# Patient Record
Sex: Male | Born: 1995 | Race: White | Marital: Married | Smoking: Never smoker
Health system: Southern US, Community
[De-identification: ages and names within clinical notes are randomized; demographics above are authoritative.]

## PROBLEM LIST (undated history)

## (undated) DIAGNOSIS — Z806 Family history of leukemia: Secondary | ICD-10-CM

## (undated) DIAGNOSIS — Z803 Family history of malignant neoplasm of breast: Secondary | ICD-10-CM

## (undated) HISTORY — DX: Family history of malignant neoplasm of breast: Z80.3

## (undated) HISTORY — PX: WISDOM TOOTH EXTRACTION: SHX21

## (undated) HISTORY — DX: Family history of leukemia: Z80.6

---

## 2018-07-03 ENCOUNTER — Other Ambulatory Visit: Payer: Self-pay | Admitting: Nurse Practitioner

## 2018-07-03 ENCOUNTER — Ambulatory Visit
Admission: RE | Admit: 2018-07-03 | Discharge: 2018-07-03 | Disposition: A | Discharge status: Home / Self Care | Payer: No Typology Code available for payment source | Source: Ambulatory Visit | Attending: Nurse Practitioner | Admitting: Nurse Practitioner

## 2018-07-03 DIAGNOSIS — Z021 Encounter for pre-employment examination: Secondary | ICD-10-CM

## 2019-10-15 ENCOUNTER — Other Ambulatory Visit: Payer: Self-pay

## 2019-10-15 ENCOUNTER — Other Ambulatory Visit: Payer: Self-pay | Admitting: Family Medicine

## 2019-10-15 ENCOUNTER — Ambulatory Visit: Payer: Self-pay

## 2019-10-15 DIAGNOSIS — S0033XA Contusion of nose, initial encounter: Secondary | ICD-10-CM

## 2020-11-05 ENCOUNTER — Emergency Department (HOSPITAL_COMMUNITY)
Admission: EM | Admit: 2020-11-05 | Discharge: 2020-11-05 | Disposition: A | Discharge status: Home / Self Care | Payer: No Typology Code available for payment source | Attending: Emergency Medicine | Admitting: Emergency Medicine

## 2020-11-05 ENCOUNTER — Other Ambulatory Visit: Payer: Self-pay

## 2020-11-05 ENCOUNTER — Encounter (HOSPITAL_COMMUNITY): Payer: Self-pay | Admitting: *Deleted

## 2020-11-05 DIAGNOSIS — W268XXA Contact with other sharp object(s), not elsewhere classified, initial encounter: Secondary | ICD-10-CM | POA: Diagnosis not present

## 2020-11-05 DIAGNOSIS — S60512A Abrasion of left hand, initial encounter: Secondary | ICD-10-CM | POA: Insufficient documentation

## 2020-11-05 DIAGNOSIS — Z23 Encounter for immunization: Secondary | ICD-10-CM | POA: Diagnosis not present

## 2020-11-05 DIAGNOSIS — L600 Ingrowing nail: Secondary | ICD-10-CM

## 2020-11-05 DIAGNOSIS — S61431A Puncture wound without foreign body of right hand, initial encounter: Secondary | ICD-10-CM

## 2020-11-05 DIAGNOSIS — S6991XA Unspecified injury of right wrist, hand and finger(s), initial encounter: Secondary | ICD-10-CM | POA: Diagnosis present

## 2020-11-05 MED ORDER — CEPHALEXIN 250 MG PO CAPS
500.0000 mg | ORAL_CAPSULE | Freq: Once | ORAL | Status: AC
  Administered 2020-11-05: 500 mg via ORAL
  Filled 2020-11-05: qty 2

## 2020-11-05 MED ORDER — TETANUS-DIPHTH-ACELL PERTUSSIS 5-2.5-18.5 LF-MCG/0.5 IM SUSY
0.5000 mL | PREFILLED_SYRINGE | Freq: Once | INTRAMUSCULAR | Status: AC
  Administered 2020-11-05: 0.5 mL via INTRAMUSCULAR
  Filled 2020-11-05: qty 0.5

## 2020-11-05 MED ORDER — CEPHALEXIN 500 MG PO CAPS: 500.0000 mg | ORAL_CAPSULE | Freq: Two times a day (BID) | ORAL | 0 refills | Status: AC

## 2020-11-05 NOTE — Discharge Instructions (Signed)
You can wash your hand wounds with regular soap and water.  Your next dose of antibiotic is due tonight.  Please complete the full course of antibiotics.  You can take Tylenol and Motrin as needed for pain.  If you notice redness streaking up your arm, or you begin having fevers, or you begin having numbness and loss of feeling in your hand, or there is pus draining from your wound, you need to seek medical attention.  These may be signs of a worsening infection that may require IV antibiotics.
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
## 2021-01-29 ENCOUNTER — Other Ambulatory Visit: Payer: Self-pay

## 2021-01-29 ENCOUNTER — Emergency Department (HOSPITAL_COMMUNITY)
Admission: EM | Admit: 2021-01-29 | Discharge: 2021-01-29 | Disposition: A | Discharge status: Home / Self Care | Payer: 59 | Attending: Emergency Medicine | Admitting: Emergency Medicine

## 2021-01-29 DIAGNOSIS — L299 Pruritus, unspecified: Secondary | ICD-10-CM | POA: Diagnosis present

## 2021-01-29 DIAGNOSIS — R21 Rash and other nonspecific skin eruption: Secondary | ICD-10-CM

## 2021-01-29 MED ORDER — PREDNISONE 10 MG (21) PO TBPK: ORAL_TABLET | Freq: Every day | ORAL | 0 refills | Status: DC
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
## 2021-01-29 NOTE — ED Triage Notes (Signed)
Pt presents to ED via ems cc rash. Pt states having a rash on bilateral lower extremities for the past 2 days. Pt states running through the woods recently. Pt reports similar rash a few months ago after finding a tick on his body. Respirations equal, unlabored. A&Ox4.

## 2021-01-29 NOTE — Discharge Instructions (Addendum)
I am prescribing you a strong steroid medication called prednisone.  Please only take this as prescribed.  Please take it early in the morning, as this medication can be stimulating and make it difficult to sleep at night.  Please continue to monitor your symptoms closely.  If you develop any new or worsening symptoms please come back to the emergency department.  It was a pleasure to meet you.

## 2021-10-06 ENCOUNTER — Ambulatory Visit: Payer: 59 | Admitting: Podiatry

## 2021-10-06 DIAGNOSIS — L600 Ingrowing nail: Secondary | ICD-10-CM

## 2021-10-12 ENCOUNTER — Encounter: Payer: Self-pay | Admitting: Podiatry

## 2021-10-12 NOTE — Progress Notes (Signed)
?  Subjective:  ?Patient ID: Robert [name], male    DOB: 01-30-1996,  MRN: [id number] ? ?Chief Complaint  ?Patient presents with  ? Ingrown Toenail  ? ? ?26 y.o. male presents with the above complaint.  Patient presents with complaint of right lateral border of the hallux ingrown.  Patient states painful to touch is progressive gotten worse.  He would like to have it removed he has not seen anyone else prior to seeing me.  Hurts with ambulation.  Pain scale 7 out of 10. ? ? ?Review of Systems: Negative except as noted in the HPI. Denies N/V/F/Ch. ? ?No past medical history on file. ? ?Current Outpatient Medications:  ?  predniSONE (STERAPRED UNI-PAK 21 TAB) 10 MG (21) TBPK tablet, Take by mouth daily. Take 6 tabs by mouth daily  for 2 days, then 5 tabs for 2 days, then 4 tabs for 2 days, then 3 tabs for 2 days, 2 tabs for 2 days, then 1 tab by mouth daily for 2 days, Disp: 42 tablet, Rfl: 0 ? ?Social History  ? ?Tobacco Use  ?Smoking Status Never  ?Smokeless Tobacco Never  ? ? ?No Known Allergies ?Objective:  ?There were no vitals filed for this visit. ?There is no height or weight on file to calculate BMI. ?Constitutional Well developed. ?Well nourished.  ?Vascular Dorsalis pedis pulses palpable bilaterally. ?Posterior tibial pulses palpable bilaterally. ?Capillary refill normal to all digits.  ?No cyanosis or clubbing noted. ?Pedal hair growth normal.  ?Neurologic Normal speech. ?Oriented to person, place, and time. ?Epicritic sensation to light touch grossly present bilaterally.  ?Dermatologic Painful ingrowing nail at lateral nail borders of the hallux nail right. ?No other open wounds. ?No skin lesions.  ?Orthopedic: Normal joint ROM without pain or crepitus bilaterally. ?No visible deformities. ?No bony tenderness.  ? ?Radiographs: None ?Assessment:  ? ?1. Ingrown toenail of right foot   ? ?Plan:  ?Patient was evaluated and treated and all questions answered. ? ?Ingrown Nail, right ?-Patient elects to proceed  with minor surgery to remove ingrown toenail removal today. Consent reviewed and signed by patient. ?-Ingrown nail excised. See procedure note. ?-Educated on post-procedure care including soaking. Written instructions provided and reviewed. ?-Patient to follow up in 2 weeks for nail check. ? ?Procedure: Excision of Ingrown Toenail ?Location: Right 1st toe lateral nail borders. ?Anesthesia: Lidocaine 1% plain; 1.5 mL and Marcaine 0.5% plain; 1.5 mL, digital block. ?Skin Prep: Betadine. ?Dressing: Silvadene; telfa; dry, sterile, compression dressing. ?Technique: Following skin prep, the toe was exsanguinated and a tourniquet was secured at the base of the toe. The affected nail border was freed, split with a nail splitter, and excised. Chemical matrixectomy was then performed with phenol and irrigated out with alcohol. The tourniquet was then removed and sterile dressing applied. ?Disposition: Patient tolerated procedure well. Patient to return in 2 weeks for follow-up.  ? ?No follow-ups on file. ?

## 2021-12-06 ENCOUNTER — Ambulatory Visit: Payer: 59 | Admitting: Podiatry

## 2021-12-06 DIAGNOSIS — L600 Ingrowing nail: Secondary | ICD-10-CM

## 2021-12-06 DIAGNOSIS — L539 Erythematous condition, unspecified: Secondary | ICD-10-CM | POA: Diagnosis not present

## 2021-12-06 MED ORDER — DOXYCYCLINE HYCLATE 100 MG PO TABS: 100.0000 mg | ORAL_TABLET | Freq: Two times a day (BID) | ORAL | 0 refills | Status: AC

## 2021-12-07 ENCOUNTER — Other Ambulatory Visit: Payer: Self-pay | Admitting: Podiatry

## 2021-12-07 ENCOUNTER — Telehealth: Payer: Self-pay | Admitting: Podiatry

## 2021-12-07 MED ORDER — CEPHALEXIN 500 MG PO CAPS: 500.0000 mg | ORAL_CAPSULE | Freq: Three times a day (TID) | ORAL | 0 refills | Status: DC

## 2021-12-07 NOTE — Telephone Encounter (Signed)
Spoke with patient - pharmacy did not have his insurance card on file with pharmacy.  Will call back if there is any other problems

## 2021-12-07 NOTE — Telephone Encounter (Signed)
Patient called and stated that he went to his pharmacy to pick up

## 2021-12-07 NOTE — Telephone Encounter (Signed)
Patient called and stated that he went to his pharmacy to pick up Doxycycline from his pharmacy. They are wanting to pay over $100.00 for it. Patient wanted to know if there was an alternative that is covered by is insurance.  Please advise

## 2022-01-02 ENCOUNTER — Encounter (HOSPITAL_COMMUNITY): Payer: Self-pay | Admitting: Emergency Medicine

## 2022-01-02 ENCOUNTER — Other Ambulatory Visit: Payer: Self-pay

## 2022-01-02 ENCOUNTER — Ambulatory Visit: Payer: Self-pay

## 2022-01-02 ENCOUNTER — Emergency Department (HOSPITAL_COMMUNITY): Payer: 59

## 2022-01-02 ENCOUNTER — Emergency Department (HOSPITAL_COMMUNITY)
Admission: EM | Admit: 2022-01-02 | Discharge: 2022-01-02 | Disposition: A | Discharge status: Home / Self Care | Payer: 59 | Attending: Emergency Medicine | Admitting: Emergency Medicine

## 2022-01-02 DIAGNOSIS — K625 Hemorrhage of anus and rectum: Secondary | ICD-10-CM | POA: Diagnosis present

## 2022-01-02 DIAGNOSIS — K648 Other hemorrhoids: Secondary | ICD-10-CM | POA: Insufficient documentation

## 2022-01-02 LAB — CBC WITH DIFFERENTIAL/PLATELET
Abs Immature Granulocytes: 0.03 10*3/uL (ref 0.00–0.07)
Basophils Absolute: 0 10*3/uL (ref 0.0–0.1)
Basophils Relative: 1 %
Eosinophils Absolute: 0.1 10*3/uL (ref 0.0–0.5)
Eosinophils Relative: 2 %
HCT: 44.4 % (ref 39.0–52.0)
Hemoglobin: 15 g/dL (ref 13.0–17.0)
Immature Granulocytes: 0 %
Lymphocytes Relative: 32 %
Lymphs Abs: 2.5 10*3/uL (ref 0.7–4.0)
MCH: 28.5 pg (ref 26.0–34.0)
MCHC: 33.8 g/dL (ref 30.0–36.0)
MCV: 84.4 fL (ref 80.0–100.0)
Monocytes Absolute: 0.5 10*3/uL (ref 0.1–1.0)
Monocytes Relative: 6 %
Neutro Abs: 4.7 10*3/uL (ref 1.7–7.7)
Neutrophils Relative %: 59 %
Platelets: 294 10*3/uL (ref 150–400)
RBC: 5.26 MIL/uL (ref 4.22–5.81)
RDW: 12.6 % (ref 11.5–15.5)
WBC: 7.9 10*3/uL (ref 4.0–10.5)
nRBC: 0 % (ref 0.0–0.2)

## 2022-01-02 LAB — COMPREHENSIVE METABOLIC PANEL
ALT: 39 U/L (ref 0–44)
AST: 24 U/L (ref 15–41)
Albumin: 4 g/dL (ref 3.5–5.0)
Alkaline Phosphatase: 58 U/L (ref 38–126)
Anion gap: 8 (ref 5–15)
BUN: 13 mg/dL (ref 6–20)
CO2: 24 mmol/L (ref 22–32)
Calcium: 9.5 mg/dL (ref 8.9–10.3)
Chloride: 108 mmol/L (ref 98–111)
Creatinine, Ser: 1.11 mg/dL (ref 0.61–1.24)
GFR, Estimated: >60 mL/min (ref >60)
Glucose, Bld: 89 mg/dL (ref 70–99)
Potassium: 4.1 mmol/L (ref 3.5–5.1)
Sodium: 140 mmol/L (ref 135–145)
Total Bilirubin: 0.6 mg/dL (ref 0.3–1.2)
Total Protein: 7.7 g/dL (ref 6.5–8.1)

## 2022-01-02 LAB — TYPE AND SCREEN
ABO/RH(D): O POS
Antibody Screen: NEGATIVE

## 2022-01-02 LAB — POC OCCULT BLOOD, ED: Fecal Occult Bld: POSITIVE — AB

## 2022-01-02 LAB — LIPASE, BLOOD: Lipase: 24 U/L (ref 11–51)

## 2022-01-02 MED ORDER — IOHEXOL 300 MG/ML  SOLN
100.0000 mL | Freq: Once | INTRAMUSCULAR | Status: AC | PRN
  Administered 2022-01-02: 100 mL via INTRAVENOUS

## 2022-01-02 MED ORDER — SODIUM CHLORIDE (PF) 0.9 % IJ SOLN
INTRAMUSCULAR | Status: AC
  Filled 2022-01-02: qty 50

## 2022-01-02 NOTE — ED Triage Notes (Signed)
Pt reports rectal bleeding x 1 week now. Reports minimal abd pain. Never had this happen before. Family hx of Cancer.

## 2022-01-02 NOTE — ED Provider Triage Note (Signed)
Emergency Medicine Provider Triage Evaluation Note  Robert [name] , a 26 y.o. male  was evaluated in triage.  Pt complains of hematochezia for the past week. The patient denies black stool but reports blood in the toilet, stool, and whenever he wipes. Denies any nausea or vomiting. The patient reports that he has some mild lower abdominal pain, otherwise is normal. Denies any recent foreign travel. Denies any fevers. Denies any melena. He reports his stool has been softer, but not watery. Reports strong family h/o cancer. Denies any trauma.   Review of Systems  Positive:  Negative:   Physical Exam  BP 133/72 (BP Location: Left Arm)   Pulse 71   Temp 98.2 F (36.8 C) (Oral)   Resp 16   SpO2 95%  Gen:   Awake, no distress   Resp:  Normal effort  MSK:   Moves extremities without difficulty  Other:  Abdomen soft and non tender  Medical Decision Making  Medically screening exam initiated at 12:30 PM.  Appropriate orders placed.  Robert [name] was informed that the remainder of the evaluation will be completed by another provider, this initial triage assessment does not replace that evaluation, and the importance of remaining in the ED until their evaluation is complete.  Will order labs and CT abd.   Robert Rich, PA-C 01/02/22 1234

## 2022-01-02 NOTE — Discharge Instructions (Addendum)
Your CT scan was without acute abnormality.  You do have evidence of occult bleeding with no active bleeding noted on exam.  Your symptoms could be due to an internal hemorrhoid.  Additionally a diverticular bleed is still possible.  Recommend you follow-up outpatient with a gastroenterologist and establish care with a PCP.  CT scan results: IMPRESSION:  1. No acute abdominal/pelvic findings, mass lesions or adenopathy.  2. No obvious anorectal abnormality. No extravasation of contrast  material is identified to suggest an active bleed.  3. 10 mm low-attenuation lesion in segment 7 of the liver is likely  a benign cyst or hemangioma.

## 2022-01-02 NOTE — ED Provider Notes (Signed)
[facility]-EMERGENCY DEPT Provider Note   CSN: [id number] Arrival date & time: 01/02/22  1149     History  Chief Complaint  Patient presents with   Rectal Bleeding    Paschal Blanton is a 26 y.o. male.   Rectal Bleeding   26 year old male presenting to the emergency department with a history of rectal bleeding for the past week.  The patient states that he has had dark blood and bright red blood in his stool whenever he wipes.  He states that it is mixed in with his stool.  He denies any abdominal pain or rectal pain.  He is concerned because he has a strong family history of cancer.  He denies any fevers or chills.  He denies any syncope.  He denies any rectal pain.  He denies a history of hemorrhoids.  Home Medications Prior to Admission medications   Medication Sig Start Date End Date Taking? Authorizing Provider  cephALEXin (KEFLEX) 500 MG capsule Take 1 capsule (500 mg total) by mouth 3 (three) times daily. 12/07/21   Candelaria Stagers, DPM  predniSONE (STERAPRED UNI-PAK 21 TAB) 10 MG (21) TBPK tablet Take by mouth daily. Take 6 tabs by mouth daily  for 2 days, then 5 tabs for 2 days, then 4 tabs for 2 days, then 3 tabs for 2 days, 2 tabs for 2 days, then 1 tab by mouth daily for 2 days 01/29/21   Placido Sou, PA-C      Allergies    Patient has no known allergies.    Review of Systems   Review of Systems  Gastrointestinal:  Positive for blood in stool and hematochezia. Negative for rectal pain.  All other systems reviewed and are negative.   Physical Exam Updated Vital Signs BP 122/73 (BP Location: Right Arm)   Pulse 75   Temp 98 F (36.7 C) (Oral)   Resp 16   SpO2 100%  Physical Exam Vitals and nursing note reviewed. Exam conducted with a chaperone present.  Constitutional:      General: He is not in acute distress.    Appearance: He is well-developed.  HENT:     Head: Normocephalic and atraumatic.  Eyes:     Conjunctiva/sclera:  Conjunctivae normal.  Cardiovascular:     Rate and Rhythm: Normal rate and regular rhythm.  Pulmonary:     Effort: Pulmonary effort is normal. No respiratory distress.     Breath sounds: Normal breath sounds.  Abdominal:     Palpations: Abdomen is soft.     Tenderness: There is no abdominal tenderness.     Comments: Abdomen soft, nontender, nondistended, no rebound or guarding  Genitourinary:    Rectum: Guaiac result positive. Internal hemorrhoid present. No mass, tenderness, anal fissure or external hemorrhoid.     Comments: Possible internal hemorrhoid palpated, no external hemorrhoids appreciated, no anal fissure appreciated, no significant rectal tenderness on exam, no purulence, fecal occult positive Musculoskeletal:        General: No swelling.     Cervical back: Neck supple.  Skin:    General: Skin is warm and dry.     Capillary Refill: Capillary refill takes less than 2 seconds.  Neurological:     Mental Status: He is alert.  Psychiatric:        Mood and Affect: Mood normal.     ED Results / Procedures / Treatments   Labs (all labs ordered are listed, but only abnormal results are displayed) Labs Reviewed  POC  OCCULT BLOOD, ED - Abnormal; Notable for the following components:      Result Value   Fecal Occult Bld POSITIVE (*)    All other components within normal limits  COMPREHENSIVE METABOLIC PANEL  CBC WITH DIFFERENTIAL/PLATELET  LIPASE, BLOOD  TYPE AND SCREEN  ABO/RH    EKG None  Radiology CT ABDOMEN PELVIS W CONTRAST  Result Date: 01/02/2022 CLINICAL DATA:  Right red blood per rectum. EXAM: CT ABDOMEN AND PELVIS WITH CONTRAST TECHNIQUE: Multidetector CT imaging of the abdomen and pelvis was performed using the standard protocol following bolus administration of intravenous contrast. RADIATION DOSE REDUCTION: This exam was performed according to the departmental dose-optimization program which includes automated exposure control, adjustment of the mA and/or  kV according to patient size and/or use of iterative reconstruction technique. CONTRAST:  OMNIPAQUE IOHEXOL 300 MG/ML  SOLN COMPARISON:  None Available. FINDINGS: Lower chest: Insert lung bases Hepatobiliary: 10 mm low-attenuation lesion in segment 7 is likely a benign cyst or hemangioma. No worrisome hepatic lesions or intrahepatic biliary dilatation. The gallbladder is unremarkable. No common bile duct dilatation. Pancreas: No mass, inflammation or ductal dilatation. Spleen: Normal size.  No focal lesions. Adrenals/Urinary Tract: The adrenal glands are normal. No renal lesions or hydronephrosis. 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 No renal or obstructing ureteral calculi or bladder calculi. Stomach/Bowel: The stomach, duodenum, small bowel and colon are. No acute inflammatory process, mass lesions or obstructive findings. The terminal ileum and appendix are normal. No obvious anorectal abnormality. No extravasation of contrast material is identified. Vascular/Lymphatic: The aorta is normal in caliber. No dissection. The branch vessels are patent. The major venous structures are patent. No mesenteric or retroperitoneal mass or adenopathy. Small scattered lymph nodes are noted. Reproductive: The prostate gland and seminal vesicles are unremarkable. Other: No pelvic mass or adenopathy. No free pelvic fluid collections. No inguinal mass or adenopathy. No abdominal wall hernia or subcutaneous lesions. Musculoskeletal: No significant bony findings. IMPRESSION: 1. No acute abdominal/pelvic findings, mass lesions or adenopathy. 2. No obvious anorectal abnormality. No extravasation of contrast material is identified to suggest an active bleed. 3. 10 mm low-attenuation lesion in segment 7 of the liver is likely a benign cyst or hemangioma. Electronically Signed   By: Rudie Meyer M.D.   On: 01/02/2022 15:45    Procedures Procedures    Medications Ordered in ED Medications  iohexol  (OMNIPAQUE) 300 MG/ML solution 100 mL (100 mLs Intravenous Contrast Given 01/02/22 1519)  sodium chloride (PF) 0.9 % injection (  Given by Other 01/02/22 1543)    ED Course/ Medical Decision Making/ A&P                           Medical Decision Making Amount and/or Complexity of Data Reviewed Labs: ordered.  Risk Prescription drug management.    26 year old male presenting to the emergency department with a history of rectal bleeding for the past week.  The patient states that he has had dark blood and bright red blood in his stool whenever he wipes.  He states that it is mixed in with his stool.  He denies any abdominal pain or rectal pain.  He is concerned because he has a strong family history of cancer.  He denies any fevers or chills.  He denies any syncope.  He denies any rectal pain.  He denies a history of hemorrhoids.  On exam, the patient was fecal occult positive with a possible internal  hemorrhoid palpated.  No external hemorrhoids appreciated, no significant tenderness or anal fissure appreciated.  Symptoms could be due to internal hemorrhoid that she is bleeding versus a diverticular bleed.    Laboratory evaluation significant for fecal occult positive, lipase normal, CMP without abnormality, CBC without abnormality, no anemia hemoglobin 15.  A CT abdomen pelvis with contrast was performed and was negative for acute abnormality in the abdomen or pelvis.  IMPRESSION:  1. No acute abdominal/pelvic findings, mass lesions or adenopathy.  2. No obvious anorectal abnormality. No extravasation of contrast  material is identified to suggest an active bleed.  3. 10 mm low-attenuation lesion in segment 7 of the liver is likely  a benign cyst or hemangioma.    Patient has no PCP at this time.  A TOC consult was placed for this.  Additionally, referral to gastroenterology for follow-up was placed due to the patient's lower GI bleeding and concern for family history of  cancer/malignancy.   Final Clinical Impression(s) / ED Diagnoses Final diagnoses:  Rectal bleeding  Internal hemorrhoid    Rx / DC Orders ED Discharge Orders          Ordered    Ambulatory referral to Gastroenterology        01/02/22 1704              Ernie Avena, MD 01/02/22 1710

## 2022-01-02 NOTE — Telephone Encounter (Signed)
    Chief Complaint: Dark and bright red blood in stool and when he wipes Symptoms: Mild abdominal pain Frequency: 1 week ago Pertinent Negatives: Patient denies constipation Disposition: [x] ED /[] Urgent Care (no appt availability in office) / [] Appointment(In office/virtual)/ []  [facility]/ [] Home Care/ [] Refused Recommended Disposition /[] [facility]/ []  Follow-up with PCP Additional Notes: Has an appointment with a new PCP in 2 weeks.  Reason for Disposition  [1] MODERATE rectal bleeding (small blood clots, passing blood without stool, or toilet water turns red) AND [2] more than once a day  Answer Assessment - Initial Assessment Questions 1. APPEARANCE of BLOOD: "What color is it?" "Is it passed separately, on the surface of the stool, or mixed in with the stool?"      Darker red, mixed in stool and toilet 2. AMOUNT: "How much blood was passed?"      Large amount 3. FREQUENCY: "How many times has blood been passed with the stools?"      1 week ago 4. ONSET: "When was the blood first seen in the stools?" (Days or weeks)      1 week 5. DIARRHEA: "Is there also some diarrhea?" If Yes, ask: "How many diarrhea stools in the past 24 hours?"      Loose stool 6. CONSTIPATION: "Do you have constipation?" If Yes, ask: "How bad is it?"     No 7. RECURRENT SYMPTOMS: "Have you had blood in your stools before?" If Yes, ask: "When was the last time?" and "What happened that time?"      No 8. BLOOD THINNERS: "Do you take any blood thinners?" (e.g., Coumadin/warfarin, Pradaxa/dabigatran, aspirin)     No 9. OTHER SYMPTOMS: "Do you have any other symptoms?"  (e.g., abdomen pain, vomiting, dizziness, fever)     Mild pain 10. PREGNANCY: "Is there any chance you are pregnant?" "When was your last menstrual period?"       N/A  Protocols used: Rectal Bleeding-A-AH

## 2023-03-20 ENCOUNTER — Encounter: Payer: Self-pay | Admitting: Podiatry

## 2023-03-20 ENCOUNTER — Ambulatory Visit: Payer: 59 | Admitting: Podiatry

## 2023-03-20 DIAGNOSIS — L600 Ingrowing nail: Secondary | ICD-10-CM | POA: Diagnosis not present

## 2023-03-20 MED ORDER — DOXYCYCLINE HYCLATE 100 MG PO TABS: 100.0000 mg | ORAL_TABLET | Freq: Two times a day (BID) | ORAL | 1 refills | Status: DC

## 2023-03-20 NOTE — Patient Instructions (Signed)

## 2023-03-21 NOTE — Progress Notes (Signed)
Subjective:   Patient ID: Robert [name], male   DOB: 27 y.o.   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  I infiltrated the right hallux 60 mg Xylocaine Marcaine mixture sterile prep done using sterile instrumentation removed the lateral border exposed matrix applied phenol 3 applications 30 seconds followed by alcohol lavage sterile dressing gave instructions on soaks wear dressing 24 hours take it off earlier if throbbing were to occur encouraged to call questions concerns which may arise

## 2023-12-10 ENCOUNTER — Encounter: Payer: Self-pay | Admitting: Family

## 2023-12-10 ENCOUNTER — Ambulatory Visit: Admitting: Family

## 2023-12-10 VITALS — BP 106/63 | HR 72 | Temp 98.8°F | Resp 16 | Ht 68.0 in | Wt 217.0 lb

## 2023-12-10 DIAGNOSIS — E66811 Obesity, class 1: Secondary | ICD-10-CM | POA: Diagnosis not present

## 2023-12-10 DIAGNOSIS — Z6832 Body mass index (BMI) 32.0-32.9, adult: Secondary | ICD-10-CM | POA: Insufficient documentation

## 2023-12-10 DIAGNOSIS — Z0000 Encounter for general adult medical examination without abnormal findings: Secondary | ICD-10-CM | POA: Insufficient documentation

## 2023-12-10 DIAGNOSIS — Z809 Family history of malignant neoplasm, unspecified: Secondary | ICD-10-CM | POA: Insufficient documentation

## 2023-12-10 DIAGNOSIS — R635 Abnormal weight gain: Secondary | ICD-10-CM

## 2023-12-10 LAB — COMPREHENSIVE METABOLIC PANEL WITH GFR
ALT: 14 U/L (ref 0–53)
AST: 11 U/L (ref 0–37)
Albumin: 4.3 g/dL (ref 3.5–5.2)
Alkaline Phosphatase: 58 U/L (ref 39–117)
BUN: 23 mg/dL (ref 6–23)
CO2: 31 meq/L (ref 19–32)
Calcium: 9.7 mg/dL (ref 8.4–10.5)
Chloride: 103 meq/L (ref 96–112)
Creatinine, Ser: 1.08 mg/dL (ref 0.40–1.50)
GFR: 93.76 mL/min (ref >60.00)
Glucose, Bld: 77 mg/dL (ref 70–99)
Potassium: 4.6 meq/L (ref 3.5–5.1)
Sodium: 141 meq/L (ref 135–145)
Total Bilirubin: 0.4 mg/dL (ref 0.2–1.2)
Total Protein: 7.1 g/dL (ref 6.0–8.3)

## 2023-12-10 LAB — CBC WITH DIFFERENTIAL/PLATELET
Absolute Lymphocytes: 2704 {cells}/uL (ref 850–3900)
Absolute Monocytes: 749 {cells}/uL (ref 200–950)
Basophils Absolute: 62 {cells}/uL (ref 0–200)
Basophils Relative: 0.6 %
Eosinophils Absolute: 73 {cells}/uL (ref 15–500)
Eosinophils Relative: 0.7 %
HCT: 44 % (ref 38.5–50.0)
Hemoglobin: 14.9 g/dL (ref 13.2–17.1)
MCH: 29 pg (ref 27.0–33.0)
MCHC: 33.9 g/dL (ref 32.0–36.0)
MCV: 85.8 fL (ref 80.0–100.0)
MPV: 9.9 fL (ref 7.5–12.5)
Monocytes Relative: 7.2 %
Neutro Abs: 6812 {cells}/uL (ref 1500–7800)
Neutrophils Relative %: 65.5 %
Platelets: 339 10*3/uL (ref 140–400)
RBC: 5.13 10*6/uL (ref 4.20–5.80)
RDW: 12.4 % (ref 11.0–15.0)
Total Lymphocyte: 26 %
WBC: 10.4 10*3/uL (ref 3.8–10.8)

## 2023-12-10 LAB — TSH: TSH: 2.65 u[IU]/mL (ref 0.35–5.50)

## 2023-12-10 NOTE — Assessment & Plan Note (Signed)
-   Reduce daily caloric intake to 1900 calories. - Reassess weight loss progress in 3 months. If no progress, consider zepbound.

## 2023-12-10 NOTE — Progress Notes (Signed)
 Subjective:     Patient ID: Robert [name], male    DOB: 20-Mar-1996, 28 y.o.   MRN: [id number]  Chief Complaint  Patient presents with   New Patient (Initial Visit)    HPI  Discussed the use of AI scribe software for clinical note transcription with the patient, who gave verbal consent to proceed.  History of Present Illness  Robert [name] is a 28 year old male who presents for an initial visit to establish care and discuss family history of cancer and weight management.  Cancer risk assessment - Extensive family history of cancer, including mother (breast cancer), father (unspecified cancer, chemotherapy within last two years), grandmother (white cell leukemia, deceased), and uncle (type unspecified) - Interest in genetic testing to assess personal cancer risk  Weight management and body composition - Concern regarding weight and family history of obesity - Since February, implemented dietary changes: eliminated energy drinks and soda, counts calories and macronutrients, meal preps with lean foods such as chicken - Current weight 217 pounds (down from 212 pounds in the morning) - Caloric intake goal of 2200 calories daily with specific macronutrient targets - Engages in intense workouts with significant strength gains - Weight loss of only five to six pounds since February, no change in pant size - Concerned about lack of fat loss despite efforts  Hearing loss - History of hearing loss attributed to time in the [employer] - Prescribed hearing aids but does not use them - Failed multiple hearing tests - Denies significant impact of hearing loss on daily life  Gastrointestinal symptoms - Regular diarrhea associated with high-protein diet, including protein shakes and Greek yogurt  Caffeine withdrawal - Experienced withdrawal symptoms, including headaches and cravings, after abrupt cessation of energy drinks  General review of systems - No cough, cold symptoms, skin  rashes, or unusual muscle or joint pain - No urinary symptoms or frequent headaches - No significant concerns about depression or anxiety, though work-related stress is present - 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 No pertinent past medical history.  Past Surgical History:  Procedure Laterality Date   WISDOM TOOTH EXTRACTION Bilateral     Family History  Problem Relation Age of Onset   Diabetes Mother    Breast cancer Mother    Leukemia Father    Leukemia Maternal Grandmother    Cancer Other    Cancer Paternal Uncle     Social History   Socioeconomic History   Marital status: Married    Spouse name: Not on file   Number of children: Not on file   Years of education: Not on file   Highest education level: 12th grade  Occupational History   Not on file  Tobacco Use   Smoking status: Never   Smokeless tobacco: Never  Vaping Use   Vaping status: Former  Substance and Sexual Activity   Alcohol use: Not Currently   Drug use: Not Currently   Sexual activity: Yes    Partners: Female  Other Topics Concern   Not on file  Social History Narrative   Married   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  No children   Social Drivers of Corporate investment banker Strain: Low Risk  (12/09/2023)   Overall Financial Resource Strain (CARDIA)    Difficulty of Paying Living Expenses: Not very hard  Food Insecurity: No Food Insecurity (12/09/2023)   Hunger Vital Sign    Worried About Running Out of Food in the Last Year: Never true    Ran Out of Food in the Last Year: Never true  Transportation Needs: No Transportation Needs (12/09/2023)   PRAPARE - Administrator, Civil Service (Medical):  No    Lack of Transportation (Non-Medical): No  Physical Activity: Sufficiently Active (12/10/2023)   Exercise Vital Sign    Days of Exercise per Week: 3 days    Minutes of Exercise per Session: 60 min  Stress: Stress Concern Present (12/09/2023)   [employer] of Occupational Health - Occupational Stress Questionnaire    Feeling of Stress: Rather much  Social Connections: Socially Isolated (12/09/2023)   Social Connection and Isolation Panel    Frequency of Communication with Friends and Family: Once a week    Frequency of Social Gatherings with Friends and Family: Once a week    Attends Religious Services: Never    Database administrator or Organizations: No    Attends Engineer, structural: Not on file    Marital Status: Married  Intimate Partner Violence: Unknown (12/31/2021)   Received from [facility]   HITS    Physically Hurt: Not on file    Insult or Talk Down To: Not on file    Threaten Physical Harm: Not on file    Scream or Curse: Not on file    Outpatient Medications Prior to Visit  Medication Sig Dispense Refill   Multiple Vitamin (MULTIVITAMINS PO) Take by mouth.     cephALEXin  (KEFLEX ) 500 MG capsule Take 1 capsule (500 mg total) by mouth 3 (three) times daily. 30 capsule 0   doxycycline  (VIBRA -TABS) 100 MG tablet Take 1 tablet (100 mg total) by mouth 2 (two) times daily. 20 tablet 1   predniSONE  (STERAPRED UNI-PAK 21 TAB) 10 MG (21) TBPK tablet Take by mouth daily. Take 6 tabs by mouth daily  for 2 days, then 5 tabs for 2 days, then 4 tabs for 2 days, then 3 tabs for 2 days, 2 tabs for 2 days, then 1 tab by mouth daily for 2 days 42 tablet 0   No facility-administered medications prior to visit.    No Known Allergies  Review of Systems  HENT:  Positive for hearing loss (from [employer]). Negative for congestion.   Eyes:  Negative for blurred vision.  Respiratory:  Negative for cough.   Cardiovascular:  Negative for leg swelling.  Gastrointestinal:   Positive for diarrhea (he attributes to protein shakes). Negative for constipation.  Genitourinary:  Negative for dysuria and frequency.  Musculoskeletal:  Negative for joint pain and myalgias.  Skin:  Negative for rash.       Left forearm rash from poison ivy  Neurological:  Negative for headaches.  Psychiatric/Behavioral:  Negative for depression. The patient is not nervous/anxious.        Objective:    Physical Exam Constitutional:      General: He is not in acute distress.    Appearance: He is well-developed.  HENT:     Head: Normocephalic and atraumatic.     Right Ear: Tympanic membrane and ear canal normal.     Left Ear: Tympanic membrane and ear canal  normal.   Cardiovascular:     Rate and Rhythm: Normal rate and regular rhythm.     Heart sounds: No murmur heard. Pulmonary:     Effort: Pulmonary effort is normal. No respiratory distress.     Breath sounds: Normal breath sounds. No wheezing or rales.   Skin:    General: Skin is warm and dry.   Neurological:     Mental Status: He is alert and oriented to person, place, and time.   Psychiatric:        Behavior: Behavior normal.        Thought Content: Thought content normal.      BP 106/63 (BP Location: Right Arm, Patient Position: Sitting, Cuff Size: Large)   Pulse 72   Temp 98.8 F (37.1 C) (Oral)   Resp 16   Ht 5' 8 (1.727 m)   Wt 217 lb (98.4 kg)   SpO2 98%   BMI 32.99 kg/m  Wt Readings from Last 3 Encounters:  12/10/23 217 lb (98.4 kg)  01/29/21 180 lb (81.6 kg)  11/05/20 180 lb (81.6 kg)       Assessment & Plan:   Problem List Items Addressed This Visit       Unprioritized   Preventative health care - Primary    Minimal weight loss despite significant lifestyle changes. Discussed potential thyroid issues and dietary adjustments. - Check thyroid function and kidney function, including electrolytes. -Immunizations up to date.      Family history of cancer   Relevant Orders   Ambulatory  referral to Genetics   Class 1 obesity with body mass index (BMI) of 32.0 to 32.9 in adult    - Reduce daily caloric intake to 1900 calories. - Reassess weight loss progress in 3 months. If no progress, consider zepbound.      Other Visit Diagnoses       Weight gain       Relevant Orders   TSH   Comp Met (CMET)   CBC with Differential/Platelet       I have discontinued Mabel Reaves's predniSONE , cephALEXin , and doxycycline . I am also having him maintain his Multiple Vitamin (MULTIVITAMINS PO).  No orders of the defined types were placed in this encounter.

## 2023-12-10 NOTE — Patient Instructions (Signed)
 VISIT SUMMARY:  Today, you visited to establish care and discuss your family history of cancer, weight management, and other health concerns. We reviewed your family history of cancer, your current weight management efforts, and other health issues including hearing loss and gastrointestinal symptoms.  YOUR PLAN:  OBESITY: You have made significant lifestyle changes but have seen minimal weight loss. -We will check your thyroid and kidney function, including electrolytes. -Reduce your daily caloric intake to 1900 calories. -We will reassess your weight loss progress in 3 months.  DIARRHEA: You have regular diarrhea, possibly linked to your high-protein diet and dairy intake. -Consider reducing dairy intake to see if symptoms improve.  CANCER RISK ASSESSMENT: You have a significant family history of cancer and are interested in genetic testing. -We will refer you to a genetic counselor for cancer risk assessment and potential genetic testing.  HEARING LOSS: You have a history of hearing loss but do not use your prescribed hearing aids. -No immediate action needed as it does not significantly impact your daily activities.  GENERAL HEALTH MAINTENANCE: We discussed the importance of managing your health information and appointments. -Please use MyChart to manage your health information and appointments.

## 2023-12-10 NOTE — Assessment & Plan Note (Addendum)
  Minimal weight loss despite significant lifestyle changes. Discussed potential thyroid issues and dietary adjustments. - Check thyroid function and kidney function, including electrolytes. -Immunizations up to date.

## 2023-12-11 ENCOUNTER — Ambulatory Visit: Payer: Self-pay | Admitting: Family

## 2024-03-13 ENCOUNTER — Ambulatory Visit: Admitting: Family

## 2024-04-03 ENCOUNTER — Emergency Department (HOSPITAL_BASED_OUTPATIENT_CLINIC_OR_DEPARTMENT_OTHER): Admitting: Radiology

## 2024-04-03 ENCOUNTER — Other Ambulatory Visit: Payer: Self-pay

## 2024-04-03 ENCOUNTER — Encounter (HOSPITAL_BASED_OUTPATIENT_CLINIC_OR_DEPARTMENT_OTHER): Payer: Self-pay

## 2024-04-03 ENCOUNTER — Emergency Department (HOSPITAL_BASED_OUTPATIENT_CLINIC_OR_DEPARTMENT_OTHER)
Admission: EM | Admit: 2024-04-03 | Discharge: 2024-04-03 | Disposition: A | Discharge status: Home / Self Care | Attending: Emergency Medicine | Admitting: Emergency Medicine

## 2024-04-03 DIAGNOSIS — Y990 Civilian activity done for income or pay: Secondary | ICD-10-CM | POA: Diagnosis not present

## 2024-04-03 DIAGNOSIS — S63636A Sprain of interphalangeal joint of right little finger, initial encounter: Secondary | ICD-10-CM | POA: Insufficient documentation

## 2024-04-03 DIAGNOSIS — S6991XA Unspecified injury of right wrist, hand and finger(s), initial encounter: Secondary | ICD-10-CM | POA: Diagnosis present

## 2024-04-03 DIAGNOSIS — M7989 Other specified soft tissue disorders: Secondary | ICD-10-CM | POA: Insufficient documentation
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
## 2024-04-03 NOTE — ED Triage Notes (Signed)
 Pt c/o R pinky finger injury approx PTA. No meds, +ROM

## 2024-04-03 NOTE — Discharge Instructions (Signed)
 Follow-up with your outpatient clinic, Workmen's Comp. clinic, as needed or as indicated by your employer, continue to wear finger brace for at least the next 2 to 3 days to allow the sprain to completely heal.  Use Tylenol and/or ibuprofen as needed for pain.

## 2024-04-10 ENCOUNTER — Inpatient Hospital Stay

## 2024-04-10 DIAGNOSIS — Z803 Family history of malignant neoplasm of breast: Secondary | ICD-10-CM | POA: Diagnosis not present

## 2024-04-10 DIAGNOSIS — Z806 Family history of leukemia: Secondary | ICD-10-CM | POA: Diagnosis not present

## 2024-04-10 DIAGNOSIS — Z1379 Encounter for other screening for genetic and chromosomal anomalies: Secondary | ICD-10-CM | POA: Diagnosis not present

## 2024-04-10 LAB — GENETIC SCREENING ORDER
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
## 2024-04-24 ENCOUNTER — Ambulatory Visit: Payer: Self-pay | Admitting: Licensed Clinical Social Worker

## 2024-04-24 ENCOUNTER — Telehealth: Payer: Self-pay | Admitting: Licensed Clinical Social Worker

## 2024-04-24 ENCOUNTER — Encounter: Payer: Self-pay | Admitting: Licensed Clinical Social Worker

## 2024-04-24 DIAGNOSIS — Z1379 Encounter for other screening for genetic and chromosomal anomalies: Secondary | ICD-10-CM

## 2024-04-24 NOTE — Progress Notes (Signed)
 HPI:   Robert [name] was previously seen in the [facility] due to a family history of cancer and concerns regarding a hereditary predisposition to cancer. Please refer to our prior cancer genetics clinic note for more information regarding our discussion, assessment and recommendations, at the time. Robert [name] recent genetic test results were disclosed to him, as were recommendations warranted by these results. These results and recommendations are discussed in more detail below.  CANCER HISTORY:  Oncology History   No history exists.    FAMILY HISTORY:  We obtained a detailed, 4-generation family history.  Significant diagnoses are listed below: Family History  Problem Relation Age of Onset   Diabetes Mother    Breast cancer Mother 47   Arthritis Mother    Leukemia Father 11   Leukemia Maternal Grandmother        White Cell Leukemia   Cancer Paternal Uncle        Unknown Cancer      GENETIC TEST RESULTS:  The [facility] CancerNext-Expanded+RNA Panel found no pathogenic mutations.   The CancerNext-Expanded gene panel offered by [facility] and includes sequencing, rearrangement, and RNA analysis for the following 77 genes: AIP, ALK, APC, ATM, AXIN2, BAP1, BARD1, BMPR1A, BRCA1, BRCA2, BRIP1, CDC73, CDH1, CDK4, CDKN1B, CDKN2A, CEBPA, CHEK2, CTNNA1, DDX41, DICER1, ETV6, FH, FLCN, GATA2, LZTR1, MAX, MBD4, MEN1, MET, MLH1, MSH2, MSH3, MSH6, MUTYH, NF1, NF2, NTHL1, PALB2, PHOX2B, PMS2, POT1, PRKAR1A, PTCH1, PTEN, RAD51C, RAD51D, RB1, RET, RPS20, RUNX1, SDHA, SDHAF2, SDHB, SDHC, SDHD, SMAD4, SMARCA4, SMARCB1, SMARCE1, STK11, SUFU, TMEM127, TP53, TSC1, TSC2, VHL, and WT1 (sequencing and deletion/duplication); EGFR, HOXB13, KIT, MITF, PDGFRA, POLD1, and POLE (sequencing only); EPCAM and GREM1 (deletion/duplication only).   The test report has been scanned into EPIC and is located under the Molecular Pathology section of the Results Review tab.  A portion of the result  report is included below for reference. Genetic testing reported out on 04/18/2024.      Even though a pathogenic variant was not identified, possible explanations for the cancer in the family may include: There may be no hereditary risk for cancer in the family. The cancers in Robert [name] family may be sporadic/familial or due to other genetic and environmental factors. There may be a gene mutation in one of these genes that current testing methods cannot detect but that chance is small. There could be another gene that has not yet been discovered, or that we have not yet tested, that is responsible for the cancer diagnoses in the family.  It is also possible there is a hereditary cause for the cancer in the family that Robert [name] did not inherit. Therefore, it is important to remain in touch with cancer genetics in the future so that we can continue to offer Robert [name] the most up to date genetic testing.   ADDITIONAL GENETIC TESTING:  We discussed with Robert [name] that his genetic testing was fairly extensive.  If there are additional relevant genes identified to increase cancer risk that can be analyzed in the future, we would be happy to discuss and coordinate this testing at that time.    CANCER SCREENING RECOMMENDATIONS:  Robert [name] test result is considered negative (normal).  This means that we have not identified a hereditary cause for his family history of cancer at this time.   An individual's cancer risk and medical management are not determined by genetic test results alone. Overall cancer risk assessment incorporates additional factors, including personal medical history,  family history, and any available genetic information that may result in a personalized plan for cancer prevention and surveillance. Therefore, it is recommended he continue to follow the cancer management and screening guidelines provided by his  primary healthcare provider.  RECOMMENDATIONS FOR FAMILY  MEMBERS:   Individuals in this family might be at some increased risk of developing cancer, over the general population risk, due to the family history of cancer.  Individuals in the family should notify their providers of the family history of cancer. We recommend women in this family have a yearly mammogram beginning at age 28, or 40 years younger than the earliest onset of cancer, an annual clinical breast exam, and perform monthly breast self-exams.  Family members should have colonoscopies by at age 16, or earlier, as recommended by their providers. Other members of the family may still carry a pathogenic variant in one of these genes that Robert [name] did not inherit. Based on the family history, we recommend his mother, who was diagnosed with breast cancer at age 48, have genetic counseling and testing. Robert [name] will let us  know if we can be of any assistance in coordinating genetic counseling and/or testing for this family member.     FOLLOW-UP:  Lastly, we discussed with Robert [name] that cancer genetics is a rapidly advancing field and it is possible that new genetic tests will be appropriate for him and/or his family members in the future. We encouraged him to remain in contact with cancer genetics on an annual basis so we can update his personal and family histories and let him know of advances in cancer genetics that may benefit this family.   Our contact number was provided. Robert [name] questions were answered to his satisfaction, and he knows he is welcome to call us  at anytime with additional questions or concerns.    Robert Cary, MS, [facility] Genetic Counselor [email or web address] Phone: [phone]

## 2024-04-24 NOTE — Telephone Encounter (Signed)
 I contacted Mr. Delpilar to discuss his genetic testing results. No pathogenic variants were identified in the 77 genes analyzed. Detailed clinic note to follow.   The test report has been scanned into EPIC and is located under the Molecular Pathology section of the Results Review tab.  A portion of the result report is included below for reference.      Dena Cary, MS, [facility] Genetic Counselor [email or web address] Phone: [phone]
